# Patient Record
Sex: Male | Born: 1985 | Hispanic: Yes | Marital: Single | Smoking: Current some day smoker
Health system: Southern US, Community
[De-identification: ages and names within clinical notes are randomized; demographics above are authoritative.]

---

## 2018-05-11 ENCOUNTER — Emergency Department (INDEPENDENT_AMBULATORY_CARE_PROVIDER_SITE_OTHER): Payer: Self-pay

## 2018-05-11 ENCOUNTER — Other Ambulatory Visit: Payer: Self-pay

## 2018-05-11 ENCOUNTER — Emergency Department
Admission: EM | Admit: 2018-05-11 | Discharge: 2018-05-11 | Disposition: A | Discharge status: Home / Self Care | Payer: Self-pay | Source: Home / Self Care | Attending: Emergency Medicine | Admitting: Emergency Medicine

## 2018-05-11 DIAGNOSIS — M5431 Sciatica, right side: Secondary | ICD-10-CM

## 2018-05-11 DIAGNOSIS — M545 Low back pain: Secondary | ICD-10-CM

## 2018-05-11 MED ORDER — PREDNISONE 10 MG (21) PO TBPK: ORAL_TABLET | ORAL | 0 refills | Status: DC

## 2018-05-11 MED ORDER — TRAMADOL HCL 50 MG PO TABS: 50.0000 mg | ORAL_TABLET | Freq: Four times a day (QID) | ORAL | 0 refills | Status: DC | PRN

## 2018-05-11 NOTE — Discharge Instructions (Signed)
Take medication as instructed. Follow-up with your family doctor. Do not take ibuprofen or Aleve while you are on prednisone.

## 2018-05-11 NOTE — ED Provider Notes (Signed)
[facility]    CSN: [id number] Arrival date & time: 05/11/18  1042     History   Chief Complaint Chief Complaint  Patient presents with  . Back Pain    HPI Anthony Neal is a 33 y.o. male.   HPI Patient was in his usual state of health until yesterday evening when while shoveling gravel he experienced severe pain in the right lower back.  He has had pain radiating down the back of his right leg to his foot.  He denies any bowel bladder incontinence.  He states he had a similar episode about 7 months ago.  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 History reviewed. No pertinent past medical history.  There are no active problems to display for this patient.   History reviewed. No pertinent surgical history.     Home Medications    Prior to Admission medications   Medication Sig Start Date End Date Taking? Authorizing Provider  predniSONE (STERAPRED UNI-PAK 21 TAB) 10 MG (21) TBPK tablet Take 6 for 1 day, 5 for 1 day, 4 for 1 day, 3 for 1 day, 2 for 1 day, 1 for 1 day 05/11/18   Collene Gobble, MD  traMADol (ULTRAM) 50 MG tablet Take 1 tablet (50 mg total) by mouth every 6 (six) hours as needed. 05/11/18   Collene Gobble, MD    Family History History reviewed. No pertinent family history.  Social History Social History   Tobacco Use  . Smoking status: Not on file  Substance Use Topics  . Alcohol use: Not on file  . Drug use: Not on file     Allergies   Patient has no known allergies.   Review of Systems Review of Systems  Constitutional: Negative.   Respiratory: Negative.   Cardiovascular: Negative.   Genitourinary: Negative.   Musculoskeletal: Positive for back pain. Negative for neck pain.  Neurological: Negative.      Physical Exam Triage Vital Signs ED Triage Vitals [05/11/18 1125]  Enc Vitals Group     BP 137/77     Pulse Rate 74     Resp 18   Temp 97.7 F (36.5 C)     Temp Source Oral     SpO2 100 %     Weight 141 lb (64 kg)     Height 5\' 5"  (1.651 m)     Head Circumference      Peak Flow      Pain Score 9     Pain Loc      Pain Edu?      Excl. in GC?    No data found.  Updated Vital Signs BP 137/77 (BP Location: Right Arm)   Pulse 74   Temp 97.7 F (36.5 C) (Oral)   Resp 18   Ht 5\' 5"  (1.651 m)   Wt 64 kg   SpO2 100%   BMI 23.46 kg/m   Visual Acuity Right Eye Distance:   Left Eye Distance:   Bilateral Distance:    Right Eye Near:   Left Eye Near:    Bilateral Near:     Physical Exam Constitutional:      Appearance: Normal appearance.  Neck:     Musculoskeletal: Normal range of motion and neck supple.  Cardiovascular:     Rate and Rhythm: Normal rate and regular rhythm.  Pulmonary:     Effort: Pulmonary effort  is normal.     Breath sounds: Normal breath sounds.  Abdominal:     General: Abdomen is flat.     Palpations: Abdomen is soft.  Musculoskeletal:     Comments: There is tenderness over L5-S1 on the right.  Straight leg raising positive on the right at 75 degrees seated.  Deep tendon reflexes knees 2+ ankles 2+.  There is no sensory deficit of the lower extremities motor strength 5 out of 5 all muscle groups.  Neurological:     Mental Status: He is alert.      UC Treatments / Results  Labs (all labs ordered are listed, but only abnormal results are displayed) Labs Reviewed - No data to display  EKG None  Radiology Dg Lumbar Spine Complete  Result Date: 05/11/2018 CLINICAL DATA:  Lower back pain after injury last night. EXAM: LUMBAR SPINE - COMPLETE 4+ VIEW COMPARISON:  None. FINDINGS: There is no evidence of lumbar spine fracture. Alignment is normal. Intervertebral disc spaces are maintained. IMPRESSION: Negative. Electronically Signed   By: Lupita Raider, M.D.   On: 05/11/2018 12:05    Procedures Procedures (including critical care time)  Medications Ordered in  UC Medications - No data to display  Initial Impression / Assessment and Plan / UC Course  I have reviewed the triage vital signs and the nursing notes.  Pertinent labs & imaging results that were available during my care of the patient were reviewed by me and considered in my medical decision making (see chart for details). Patient has a LS strain with radiculopathy down the right leg.  X-rays are normal.  Patient will be given medications for pain along with a Dosepak.  PMP aware consulted he has no prescriptions for pain medications in the last 3 years.      Final Clinical Impressions(s) / UC Diagnoses   Final diagnoses:  Sciatica of right side     Discharge Instructions     Take medication as instructed. Follow-up with your family doctor. Do not take ibuprofen or Aleve while you are on prednisone.     ED Prescriptions    Medication Sig Dispense Auth. Provider   traMADol (ULTRAM) 50 MG tablet Take 1 tablet (50 mg total) by mouth every 6 (six) hours as needed. 15 tablet Collene Gobble, MD   predniSONE (STERAPRED UNI-PAK 21 TAB) 10 MG (21) TBPK tablet Take 6 for 1 day, 5 for 1 day, 4 for 1 day, 3 for 1 day, 2 for 1 day, 1 for 1 day 21 tablet Linn Goetze, Maylon Peppers, MD     Controlled Substance Prescriptions [place] Controlled Substance Registry consulted? Yes, I have consulted the [place] Controlled Substances Registry for this patient, and feel the risk/benefit ratio today is favorable for proceeding with this prescription for a controlled substance.   Collene Gobble, MD 05/11/18 1239

## 2018-05-11 NOTE — ED Triage Notes (Signed)
Pt was shoveling gravel last night around 7 or 8 and felt a pull in his back, and is having severe pain the shoots down right buttocks into right leg.

## 2019-04-17 ENCOUNTER — Encounter: Payer: Self-pay | Admitting: *Deleted

## 2019-04-17 ENCOUNTER — Emergency Department
Admission: EM | Admit: 2019-04-17 | Discharge: 2019-04-17 | Disposition: A | Discharge status: Home / Self Care | Payer: Self-pay | Source: Home / Self Care | Attending: Family Medicine | Admitting: Family Medicine

## 2019-04-17 ENCOUNTER — Other Ambulatory Visit: Payer: Self-pay

## 2019-04-17 DIAGNOSIS — R05 Cough: Secondary | ICD-10-CM

## 2019-04-17 DIAGNOSIS — U071 COVID-19: Secondary | ICD-10-CM

## 2019-04-17 DIAGNOSIS — J029 Acute pharyngitis, unspecified: Secondary | ICD-10-CM

## 2019-04-17 LAB — POC SARS CORONAVIRUS 2 AG -  ED: SARS Coronavirus 2 Ag: POSITIVE — AB

## 2019-04-17 NOTE — ED Triage Notes (Signed)
Pt c/po cough, HA, chills and body aches x 1 wk.Denies fever.

## 2019-04-17 NOTE — Discharge Instructions (Addendum)
Take plain guaifenesin (1200mg  extended release tabs such as Mucinex) twice daily, with plenty of water, for cough and congestion.  May add Pseudoephedrine (30mg , one or two every 4 to 6 hours) for sinus congestion.  Get adequate rest.   May take Delsym Cough Suppressant at bedtime for nighttime cough.  Try warm salt water gargles for sore throat.  Stop all antihistamines (Allegra, Nyquil, etc) for now, and other non-prescription cough/cold preparations. May take Ibuprofen 200mg , 4 tabs every 8 hours with food for leg pain, body aches, etc. May take Tylenol 2 or 3 times as needed for pain.   Your COVID19 test is positive.  You are infected with the novel coronavirus and could give the virus to others.  Please continue isolation at home for at least 10 days since the start of your symptoms.  Once you complete your 10 day quarantine, you may return to normal activities as long as you've not had a fever for over 24 hours (without taking fever reducing medicine) and your symptoms are improving. Please continue good preventive care measures, including:  frequent hand-washing, avoid touching your face, cover coughs/sneezes, stay out of crowds and keep a 6 foot distance from others.  Go to the nearest hospital emergency room if fever/cough/breathlessness are severe or illness seems like a threat to life.

## 2019-04-17 NOTE — ED Provider Notes (Signed)
[facility]    CSN: [id number] Arrival date & time: 04/17/19  6767      History   Chief Complaint Chief Complaint  Patient presents with  . Cough    HPI Anthony Neal is a 33 y.o. male.   Patient complains of one week history of typical cold-like symptoms developing over several days, including mild sore throat, sinus congestion, headache, fatigue, and cough.  The fatigue and cough have become worse.  He denies shortness of breath and changes in taste/smell.    The history is provided by the patient.    History reviewed. No pertinent past medical history.  There are no problems to display for this patient.   History reviewed. No pertinent surgical history.     Home Medications    Prior to Admission medications   Not on File    Family History History reviewed. No pertinent family history.  Social History Social History   Tobacco Use  . Smoking status: Current Some Day Smoker  . Smokeless tobacco: Never Used  Substance Use Topics  . Alcohol use: Never  . Drug use: Never     Allergies   Patient has no known allergies.   Review of Systems Review of Systems + sore throat + cough No pleuritic pain, but has tightness in anterior chest No wheezing + nasal congestion + post-nasal drainage No sinus pain/pressure No itchy/red eyes No earache No hemoptysis No SOB No fever, + chills No nausea No vomiting No abdominal pain No diarrhea No urinary symptoms No skin rash + fatigue + myalgias + headache Used OTC meds (Ibuprofen) without relief   Physical Exam Triage Vital Signs ED Triage Vitals  Enc Vitals Group     BP 04/17/19 0912 128/73     Pulse Rate 04/17/19 0912 78     Resp 04/17/19 0912 16     Temp 04/17/19 0912 98 F (36.7 C)     Temp Source 04/17/19 0912 Oral     SpO2 04/17/19 0912 98 %     Weight 04/17/19 0911 140 lb (63.5 kg)     Height 04/17/19 0911 5\' 5"  (1.651 m)     Head Circumference --      Peak Flow --      Pain Score 04/17/19 0913 0     Pain Loc --      Pain Edu? --      Excl. in GC? --    No data found.  Updated Vital Signs BP 128/73 (BP Location: Right Arm)   Pulse 78   Temp 98 F (36.7 C) (Oral)   Resp 16   Ht 5\' 5"  (1.651 m)   Wt 63.5 kg   SpO2 98%   BMI 23.30 kg/m   Visual Acuity Right Eye Distance:   Left Eye Distance:   Bilateral Distance:    Right Eye Near:   Left Eye Near:    Bilateral Near:     Physical Exam Nursing notes and Vital Signs reviewed. Appearance:  Patient appears stated age, and in no acute distress Eyes:  Pupils are equal, round, and reactive to light and accomodation.  Extraocular movement is intact.  Conjunctivae are not inflamed  Ears:  Canals normal.  Tympanic membranes normal.  Nose:  Mildly congested turbinates.  No sinus tenderness.   Pharynx:  Normal Neck:  Supple.  Mildly enlarged lateral nodes are present, tender to palpation on the left.   Lungs:  Clear to auscultation.  Breath sounds are equal.  Moving air well. Heart:  Regular rate and rhythm without murmurs, rubs, or gallops.  Abdomen:  Nontender without masses or hepatosplenomegaly.  Bowel sounds are present.  No CVA or flank tenderness.  Extremities:  No edema.  Skin:  No rash present.   UC Treatments / Results  Labs (all labs ordered are listed, but only abnormal results are displayed) Labs Reviewed  POC SARS CORONAVIRUS 2 AG -  ED - Abnormal; Notable for the following components:      Result Value   SARS Coronavirus 2 Ag Positive (*)    All other components within normal limits    EKG   Radiology No results found.  Procedures Procedures (including critical care time)  Medications Ordered in UC Medications - No data to display  Initial Impression / Assessment and Plan / UC Course  I have reviewed the triage vital signs and the nursing notes.  Pertinent labs & imaging results that were available during my care of the patient were reviewed by me and considered  in my medical decision making (see chart for details).    Benign exam.  Treat symptomatically for now.   Final Clinical Impressions(s) / UC Diagnoses   Final diagnoses:  [id number] virus infection     Discharge Instructions     Take plain guaifenesin (1200mg  extended release tabs such as Mucinex) twice daily, with plenty of water, for cough and congestion.  May add Pseudoephedrine (30mg , one or two every 4 to 6 hours) for sinus congestion.  Get adequate rest.   May take Delsym Cough Suppressant at bedtime for nighttime cough.  Try warm salt water gargles for sore throat.  Stop all antihistamines (Allegra, Nyquil, etc) for now, and other non-prescription cough/cold preparations. May take Ibuprofen 200mg , 4 tabs every 8 hours with food for leg pain, body aches, etc. May take Tylenol 2 or 3 times as needed for pain.   Your COVID19 test is positive.  You are infected with the novel coronavirus and could give the virus to others.  Please continue isolation at home for at least 10 days since the start of your symptoms.  Once you complete your 10 day quarantine, you may return to normal activities as long as you've not had a fever for over 24 hours (without taking fever reducing medicine) and your symptoms are improving. Please continue good preventive care measures, including:  frequent hand-washing, avoid touching your face, cover coughs/sneezes, stay out of crowds and keep a 6 foot distance from others.  Go to the nearest hospital emergency room if fever/cough/breathlessness are severe or illness seems like a threat to life.    ED Prescriptions    None        Kandra Nicolas, MD 04/17/19 1016

## 2020-03-03 IMAGING — DX DG LUMBAR SPINE COMPLETE 4+V
5 series · 5 of 5 positions shown · non-contrast
Comparison: None.

CLINICAL DATA: Lower back pain after injury last night.

EXAM:
LUMBAR SPINE - COMPLETE 4+ VIEW

[l-spine ap]
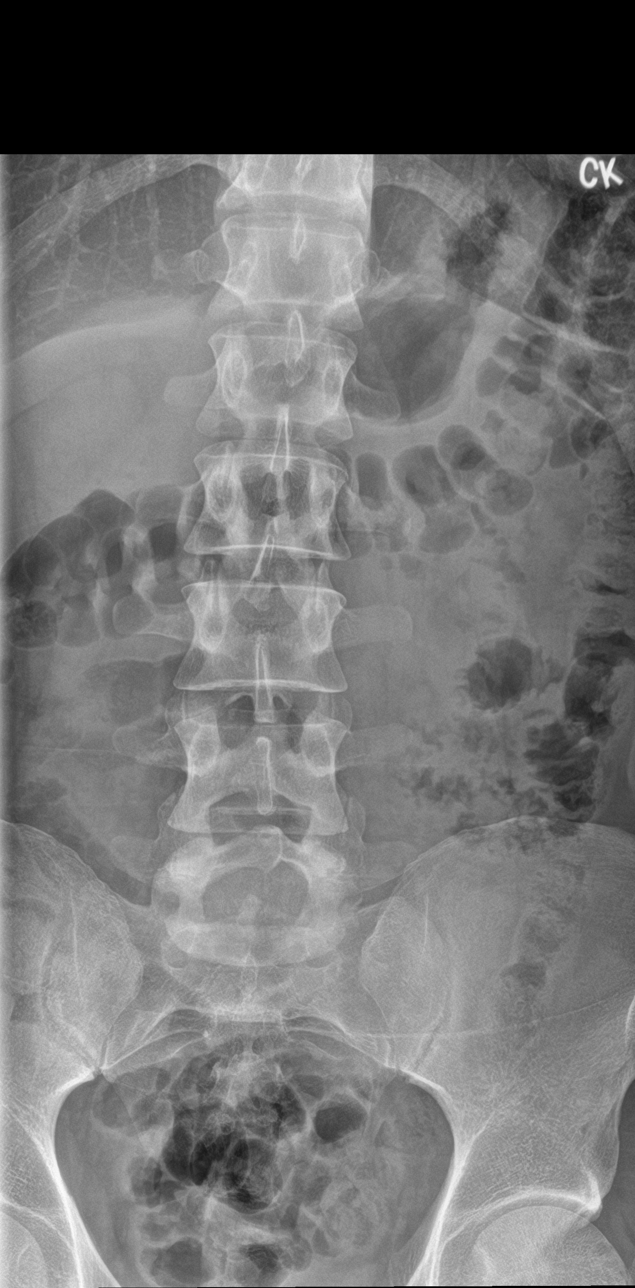

[l-spine obl (1 of 2)]
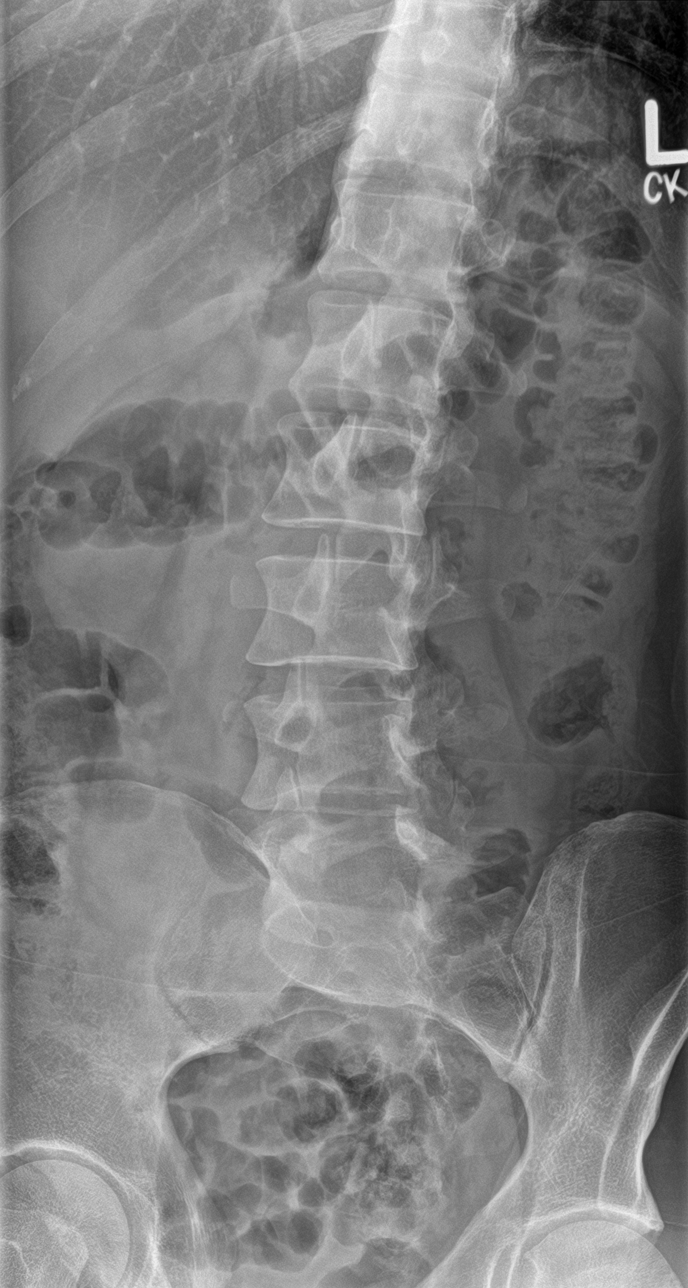

[l-spine obl (2 of 2)]
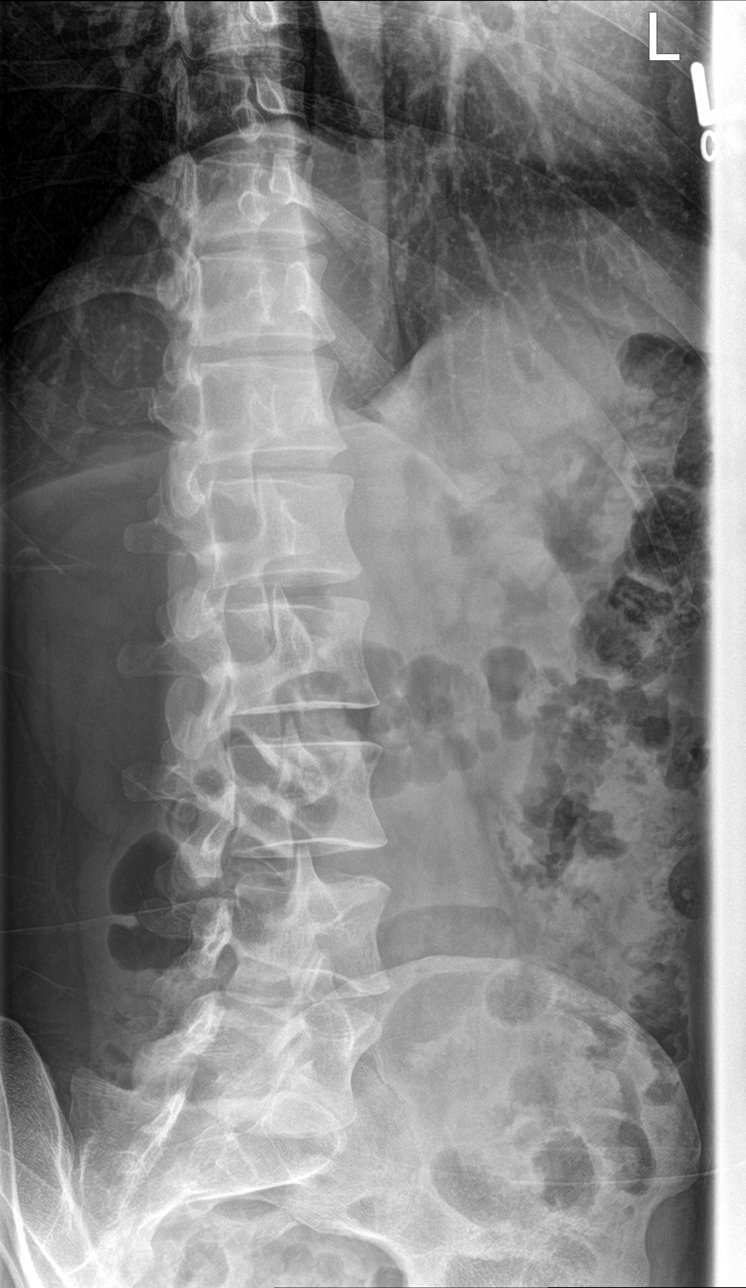

[l-spine lat]
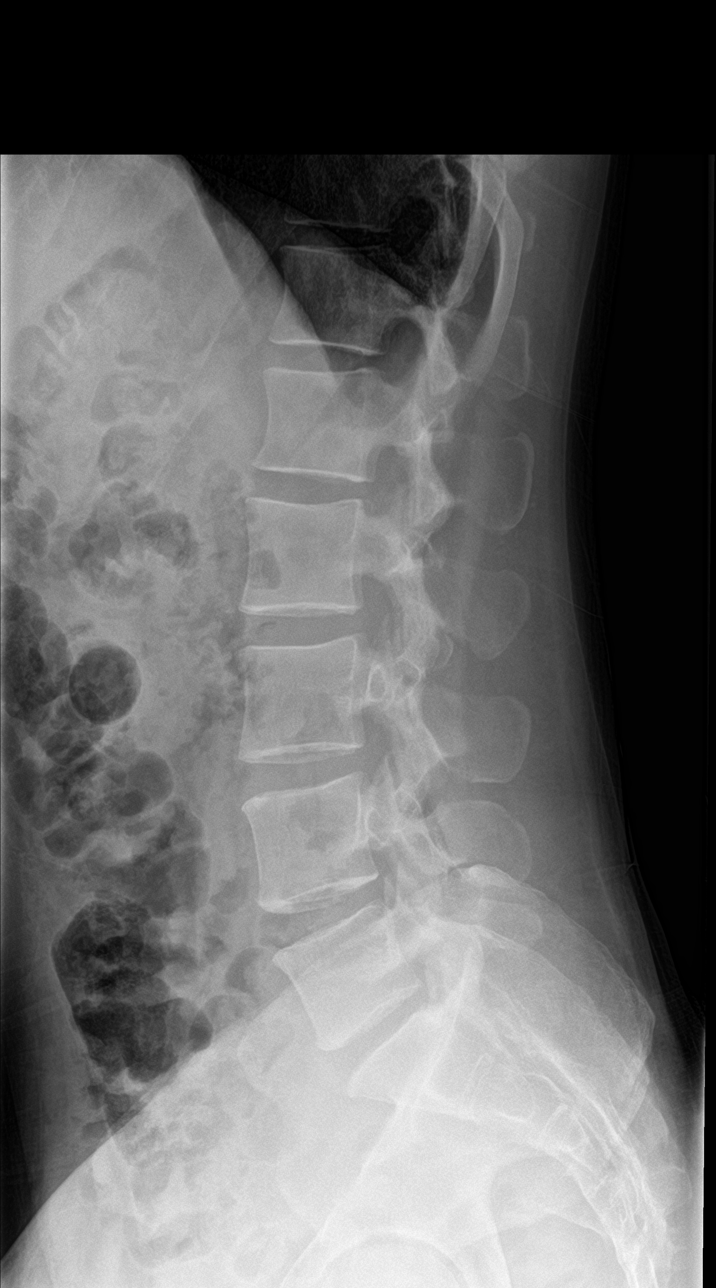

[l-spine spot]
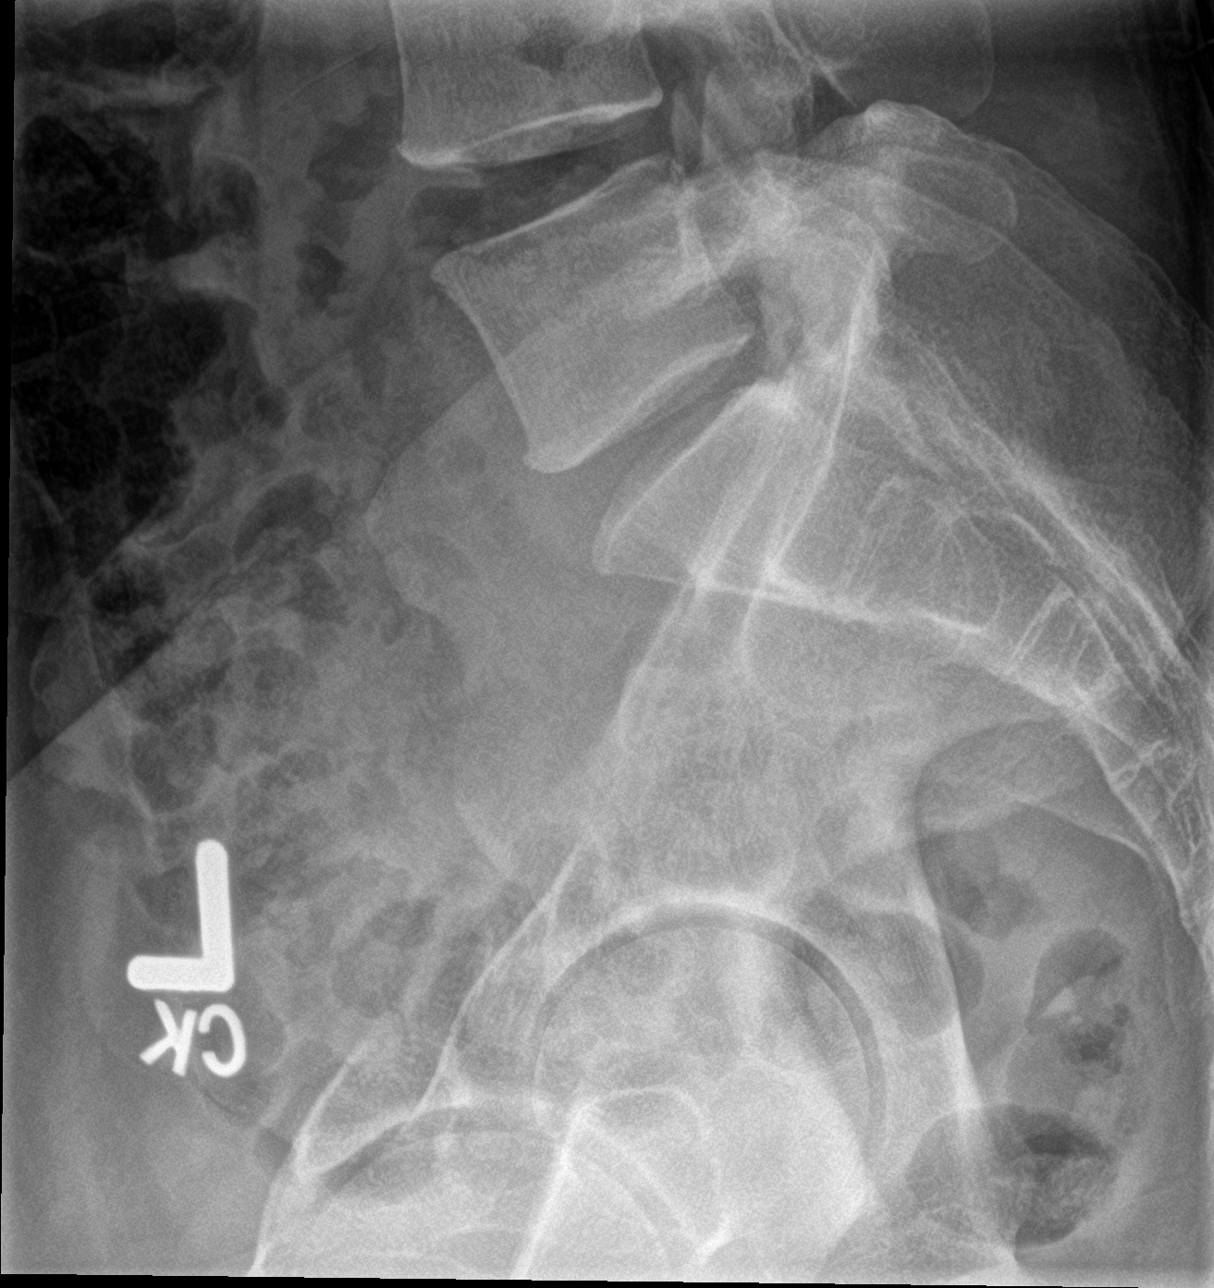

[5 of 5 positions shown; findings below may reference images not displayed]

FINDINGS: There is no evidence of lumbar spine fracture. Alignment is normal.
Intervertebral disc spaces are maintained.
IMPRESSION: Negative.

## 2021-07-26 ENCOUNTER — Other Ambulatory Visit: Payer: Self-pay

## 2021-07-26 ENCOUNTER — Emergency Department (HOSPITAL_BASED_OUTPATIENT_CLINIC_OR_DEPARTMENT_OTHER)
Admission: EM | Admit: 2021-07-26 | Discharge: 2021-07-26 | Disposition: A | Discharge status: Home / Self Care | Payer: BC Managed Care – PPO | Attending: Emergency Medicine | Admitting: Emergency Medicine

## 2021-07-26 ENCOUNTER — Encounter (HOSPITAL_BASED_OUTPATIENT_CLINIC_OR_DEPARTMENT_OTHER): Payer: Self-pay | Admitting: Emergency Medicine

## 2021-07-26 DIAGNOSIS — M5441 Lumbago with sciatica, right side: Secondary | ICD-10-CM | POA: Diagnosis not present

## 2021-07-26 DIAGNOSIS — M545 Low back pain, unspecified: Secondary | ICD-10-CM | POA: Diagnosis present

## 2021-07-26 MED ORDER — METHOCARBAMOL 500 MG PO TABS
500.0000 mg | ORAL_TABLET | Freq: Once | ORAL | Status: AC
  Administered 2021-07-26: 500 mg via ORAL
  Filled 2021-07-26: qty 1

## 2021-07-26 MED ORDER — KETOROLAC TROMETHAMINE 30 MG/ML IJ SOLN
30.0000 mg | Freq: Once | INTRAMUSCULAR | Status: AC
  Administered 2021-07-26: 30 mg via INTRAMUSCULAR
  Filled 2021-07-26: qty 1

## 2021-07-26 MED ORDER — METHOCARBAMOL 500 MG PO TABS: 500.0000 mg | ORAL_TABLET | Freq: Two times a day (BID) | ORAL | 0 refills | Status: AC

## 2021-07-26 MED ORDER — NAPROXEN 500 MG PO TABS: 500.0000 mg | ORAL_TABLET | Freq: Two times a day (BID) | ORAL | 0 refills | Status: AC

## 2021-07-26 NOTE — Discharge Instructions (Signed)
You have been diagnosed with right lower back pain and sciatica after a soccer injury. I have prescribed you both, a muscle relaxer (Robaxin), and an antiinflammatory (Naproxen). Please take Naproxen every 12 hours as prescribed. Please take Robaxin as needed for pain. Do not take Robaxin prior to driving or operating heavy machinery as it can make you sleepy. I also recommend heating pads, stretching as tolerated, walking as tolerated as all of these will help your pain in the long run. Please call the Orthopedic office to schedule an appointment for further management as they may be able to offer you more solutions.  ? ?If you develop any concerning symptoms such as the inability to urinate or have a bowel movement, not able to control your bowel or bladder, problems with erections, or numbness in your upper inner thighs and genital area.  ?

## 2021-07-26 NOTE — ED Provider Notes (Signed)
?[facility] ?Provider Note ? ? ?CSN: [id number] ?Arrival date & time: 07/26/21  0845 ? ?  ? ?History ? ?Chief Complaint  ?Patient presents with  ? Back Pain  ? ? ?Anthony Neal is a 36 y.o. male.  Presents emergency department with complaint of lower back pain.  He said yesterday he was playing soccer when he did a sudden twisting motion of his lower back.  Says he started noticing pain in the right lower side.  He was noticing it after he was playing soccer and it hurt really bad, but he was still able to function normally.  He says he went to bed last night and woke up having intense right lower back pain down the right side of his buttocks.  It is worse with movement.  He denies any saddle anesthesia, numbness, gait abnormalities, bowel or bladder dysfunction.  He has had lower back pain like this in the past with a clear injury or inciting event and had similar symptoms.  In the past he has had medication and has went to a chiropractor to deal with his symptoms.  Denies any urinary symptoms such as diarrhea or dysuria, testicular pain, fevers/chills, or abdominal pain. ? ? ?Back Pain ? ?  ? ?Home Medications ?Prior to Admission medications   ?Medication Sig Start Date End Date Taking? Authorizing Provider  ?methocarbamol (ROBAXIN) 500 MG tablet Take 1 tablet (500 mg total) by mouth 2 (two) times daily for 14 days. 07/26/21 08/09/21 Yes Lashawne Dura, Finis Bud, PA-C  ?naproxen (NAPROSYN) 500 MG tablet Take 1 tablet (500 mg total) by mouth 2 (two) times daily for 7 days. 07/26/21 08/02/21 Yes Stassi Fadely, Finis Bud, PA-C  ?   ? ?Allergies    ?Patient has no known allergies.   ? ?Review of Systems   ?Review of Systems  ?Musculoskeletal:  Positive for back pain.  ?All other systems reviewed and are negative. ? ?Physical Exam ?Updated Vital Signs ?BP 119/80 (BP Location: Right Arm)   Pulse 64   Temp 97.7 ?F (36.5 ?C) (Oral)   Resp 18   Ht 5\' 5"  (1.651 m)   Wt 61.2 kg   SpO2 100%   BMI 22.47 kg/m?   ?Physical Exam ?Vitals and nursing note reviewed.  ?Constitutional:   ?   General: He is not in acute distress. ?   Appearance: Normal appearance. He is well-developed. He is not ill-appearing, toxic-appearing or diaphoretic.  ?HENT:  ?   Head: Normocephalic and atraumatic.  ?   Nose: No nasal deformity.  ?   Mouth/Throat:  ?   Lips: Pink. No lesions.  ?Eyes:  ?   General: Gaze aligned appropriately. No scleral icterus.    ?   Right eye: No discharge.     ?   Left eye: No discharge.  ?   Conjunctiva/sclera: Conjunctivae normal.  ?   Right eye: Right conjunctiva is not injected. No exudate or hemorrhage. ?   Left eye: Left conjunctiva is not injected. No exudate or hemorrhage. ?Cardiovascular:  ?   Pulses: Normal pulses.  ?Pulmonary:  ?   Effort: Pulmonary effort is normal. No respiratory distress.  ?Abdominal:  ?   General: Abdomen is flat. There is no distension.  ?   Palpations: Abdomen is soft.  ?   Tenderness: There is no abdominal tenderness. There is no right CVA tenderness, left CVA tenderness, guarding or rebound.  ?Musculoskeletal:  ?   Comments: No midline tenderness of spine, no stepoff or deformity;  reproducible muscular tenderness in paraspinal muscles ?DP/PT pulses 2+ and equal bilaterally ?No leg edema ?Sensation grossly intact on anterior thighs, dorsum of foot and lateral foot ?Strength of knee flexion and extension is 5/5 ?Plantar and dorsiflexion of ankle 5/5 ?Gait with difficulty due to pain ?+ straight leg raise on Right, absent on left ?  ?Skin: ?   General: Skin is warm and dry.  ?Neurological:  ?   Mental Status: He is alert and oriented to person, place, and time.  ?Psychiatric:     ?   Mood and Affect: Mood normal.     ?   Speech: Speech normal.     ?   Behavior: Behavior normal. Behavior is cooperative.  ? ? ?ED Results / Procedures / Treatments   ?Labs ?(all labs ordered are listed, but only abnormal results are displayed) ?Labs Reviewed - No data to display ? ?EKG ?None ? ?Radiology ?No  results found. ? ?Procedures ?Procedures  ? ?Medications Ordered in ED ?Medications  ?methocarbamol (ROBAXIN) tablet 500 mg (500 mg Oral Given 07/26/21 0927)  ?ketorolac (TORADOL) 30 MG/ML injection 30 mg (30 mg Intramuscular Given 07/26/21 0928)  ? ? ?ED Course/ Medical Decision Making/ A&P ?  ?                        ?Medical Decision Making ?Risk ?Prescription drug management. ? ? ? ?MDM  ?This is a 36 y.o. male who presents to the ED with right lower back pain ?The differential of this patient includes but is not limited to AAA rupture, Cauda Equina, Compression Fracture, Epidural Abscess, herniated Disc, Kidney Stone, Nonspecific, Pyelonephritis, Vertebral Fracture. ? ?My Impression, Plan, and ED Course: Patient presents with lower back pain after known traumatic event. He has no red flag symptoms. I doubt that this is cauda equina. No urinary symptoms to suggest UTI/Pyelo. Msk injury more likely with inciting injury than kidney stone. Doubt compression fracture given no midline tenderness. No infectious symptoms or risk factors to suggest epidural abscess or osteo. This could be a herniated disc, but imaging is not indicated at this time.  ? ?I will treat his symptoms with Robaxin and Toradol here. Plan to send to Ortho for further management. Return precautions provided if red flag symptoms occur.  ? ?Charting Requirements ?Additional history is obtained from:  Independent historian ?External Records from outside source obtained and reviewed including: Previous UC note regarding previous similar symptoms ?Social Determinants of Health:  none ?Pertinant PMH that complicates patient's illness: n/a ? ?Patient Care ?Problems that were addressed during this visit: ?- Lower back pain with sciatica: Acute illness with complication ?Disposition: ortho f/u ? ?This is was an independent visit. My attending physician was immediately available if needed. ? ?Portions of this note were generated with Scientist, clinical (histocompatibility and immunogenetics).  Dictation errors may occur despite best attempts at proofreading. ? Final Clinical Impression(s) / ED Diagnoses ?Final diagnoses:  ?Acute right-sided low back pain with right-sided sciatica  ? ? ?Rx / DC Orders ?ED Discharge Orders   ? ?      Ordered  ?  methocarbamol (ROBAXIN) 500 MG tablet  2 times daily       ? 07/26/21 0936  ?  naproxen (NAPROSYN) 500 MG tablet  2 times daily       ? 07/26/21 0936  ? ?  ?  ? ?  ? ? ?  ?Claudie Leach, PA-C ?07/26/21 1057 ? ?  ?Lorre Nick, MD ?  07/30/21 0819 ? ?

## 2021-07-26 NOTE — ED Triage Notes (Signed)
Pt arrives to ED with c/o lower back pain. This started yesterday when patient was playing soccer. He reports the pain started after a twisting motion. The pain is described as a sharp. The pain does radiate down the right leg. Pt denies urinary/bowel incontinence.  ?

## 2022-04-06 ENCOUNTER — Ambulatory Visit
Admission: EM | Admit: 2022-04-06 | Discharge: 2022-04-06 | Disposition: A | Discharge status: Home / Self Care | Payer: BC Managed Care – PPO | Attending: Physician Assistant | Admitting: Physician Assistant

## 2022-04-06 DIAGNOSIS — J069 Acute upper respiratory infection, unspecified: Secondary | ICD-10-CM | POA: Insufficient documentation

## 2022-04-06 DIAGNOSIS — Z1152 Encounter for screening for COVID-19: Secondary | ICD-10-CM | POA: Diagnosis not present

## 2022-04-06 LAB — POCT INFLUENZA A/B
Influenza A, POC: NEGATIVE
Influenza B, POC: NEGATIVE

## 2022-04-06 NOTE — ED Triage Notes (Signed)
Pt presents with sinus headache, congestion, chills, generalized body aches, nasal drainage, and fatigue since waking up this morning.

## 2022-04-07 ENCOUNTER — Encounter: Payer: Self-pay | Admitting: Physician Assistant

## 2022-04-07 LAB — SARS CORONAVIRUS 2 (TAT 6-24 HRS): SARS Coronavirus 2: NEGATIVE

## 2022-04-07 NOTE — ED Provider Notes (Signed)
[facility]    CSN: [id number] Arrival date & time: 04/06/22  1102      History   Chief Complaint Chief Complaint  Patient presents with   Headache   Generalized Body Aches   Chills   URI   Fatigue    HPI Muhsin Doris is a 36 y.o. male.   Patient here today for evaluation of sinus congestion and headache, chills, body aches, nasal drainage and fatigue that started when he woke this morning.  He notes he has taken over-the-counter medication without resolution.  He has not had any vomiting or diarrhea.  The history is provided by the patient.  Headache Associated symptoms: congestion, cough, fever, sore throat and URI   Associated symptoms: no abdominal pain, no diarrhea, no ear pain, no nausea and no vomiting   URI Presenting symptoms: congestion, cough, fever and sore throat   Presenting symptoms: no ear pain   Associated symptoms: headaches     History reviewed. No pertinent past medical history.  There are no problems to display for this patient.   History reviewed. No pertinent surgical history.     Home Medications    Prior to Admission medications   Not on File    Family History History reviewed. No pertinent family history.  Social History Social History   Tobacco Use   Smoking status: Some Days   Smokeless tobacco: Never  Vaping Use   Vaping Use: Never used  Substance Use Topics   Alcohol use: Never   Drug use: Never     Allergies   Patient has no known allergies.   Review of Systems Review of Systems  Constitutional:  Positive for chills and fever.  HENT:  Positive for congestion and sore throat. Negative for ear pain.   Eyes:  Negative for discharge and redness.  Respiratory:  Positive for cough. Negative for shortness of breath.   Gastrointestinal:  Negative for abdominal pain, diarrhea, nausea and vomiting.  Neurological:  Positive for headaches.     Physical Exam Triage Vital Signs ED Triage Vitals  Enc  Vitals Group     BP 04/06/22 1444 106/72     Pulse Rate 04/06/22 1444 89     Resp 04/06/22 1444 18     Temp 04/06/22 1444 99.9 F (37.7 C)     Temp Source 04/06/22 1444 Oral     SpO2 04/06/22 1444 99 %     Weight --      Height --      Head Circumference --      Peak Flow --      Pain Score 04/06/22 1445 6     Pain Loc --      Pain Edu? --      Excl. in GC? --    No data found.  Updated Vital Signs BP 106/72 (BP Location: Left Arm)   Pulse 89   Temp 99.9 F (37.7 C) (Oral)   Resp 18   SpO2 99%   Physical Exam Vitals and nursing note reviewed.  Constitutional:      General: He is not in acute distress.    Appearance: Normal appearance. He is not ill-appearing.  HENT:     Head: Normocephalic and atraumatic.     Nose: Congestion present.     Mouth/Throat:     Mouth: Mucous membranes are moist.     Pharynx: Oropharynx is clear. No oropharyngeal exudate or posterior oropharyngeal erythema.  Eyes:     Conjunctiva/sclera: Conjunctivae  normal.  Cardiovascular:     Rate and Rhythm: Normal rate and regular rhythm.     Heart sounds: Normal heart sounds. No murmur heard. Pulmonary:     Effort: Pulmonary effort is normal. No respiratory distress.     Breath sounds: Normal breath sounds. No wheezing, rhonchi or rales.  Skin:    General: Skin is warm and dry.  Neurological:     Mental Status: He is alert.  Psychiatric:        Mood and Affect: Mood normal.        Thought Content: Thought content normal.      UC Treatments / Results  Labs (all labs ordered are listed, but only abnormal results are displayed) Labs Reviewed  SARS CORONAVIRUS 2 (TAT 6-24 HRS)  POCT INFLUENZA A/B    EKG   Radiology No results found.  Procedures Procedures (including critical care time)  Medications Ordered in UC Medications - No data to display  Initial Impression / Assessment and Plan / UC Course  I have reviewed the triage vital signs and the nursing notes.  Pertinent labs  & imaging results that were available during my care of the patient were reviewed by me and considered in my medical decision making (see chart for details).    Suspect viral etiology of symptoms.  Will screen for COVID and flu.  Recommend symptomatic treatment, increase fluids and rest while awaiting results.  Encouraged follow-up with any further concerns or worsening symptoms.  Final Clinical Impressions(s) / UC Diagnoses   Final diagnoses:  Encounter for screening for COVID-19  Acute upper respiratory infection   Discharge Instructions   None    ED Prescriptions   None    PDMP not reviewed this encounter.   Tomi Bamberger, PA-C 04/07/22 1320

## 2023-06-20 ENCOUNTER — Ambulatory Visit
Admission: EM | Admit: 2023-06-20 | Discharge: 2023-06-20 | Disposition: A | Discharge status: Home / Self Care | Payer: No Typology Code available for payment source | Attending: Emergency Medicine | Admitting: Emergency Medicine

## 2023-06-20 ENCOUNTER — Encounter: Payer: Self-pay | Admitting: Emergency Medicine

## 2023-06-20 DIAGNOSIS — Z87891 Personal history of nicotine dependence: Secondary | ICD-10-CM | POA: Diagnosis not present

## 2023-06-20 DIAGNOSIS — R300 Dysuria: Secondary | ICD-10-CM | POA: Insufficient documentation

## 2023-06-20 DIAGNOSIS — Z113 Encounter for screening for infections with a predominantly sexual mode of transmission: Secondary | ICD-10-CM | POA: Diagnosis not present

## 2023-06-20 LAB — POCT URINALYSIS DIP (MANUAL ENTRY)
Bilirubin, UA: NEGATIVE
Blood, UA: NEGATIVE
Glucose, UA: NEGATIVE mg/dL
Ketones, POC UA: NEGATIVE mg/dL
Leukocytes, UA: NEGATIVE
Nitrite, UA: NEGATIVE
Protein Ur, POC: NEGATIVE mg/dL
Spec Grav, UA: 1.02 (ref 1.010–1.025)
Urobilinogen, UA: 0.2 U/dL
pH, UA: 7 (ref 5.0–8.0)

## 2023-06-20 NOTE — ED Provider Notes (Signed)
 [facility]    CSN: [id number] Arrival date & time: 06/20/23  7829      History   Chief Complaint Chief Complaint  Patient presents with   Dysuria    HPI Anthony Neal is a 38 y.o. male.   Patient presents to clinic with concern of dysuria for the past 3 days.  Noticed that the first time when he urinated it was very painful, has become a little less painful over the past 3 days but it is still causing discomfort.  At the end of the first time he urinated with discomfort he noticed that the end of his urination felt sticky.  He does drink coffee and uses preworkout after work when he goes to [employer]  He is married, has had other sexual partners outside of his marriage.  Reports he works in [employer].   Has not had any hematuria.  No flank pain.  Without nausea, vomiting, scrotal pain, sores or lesions.  The history is provided by the patient and medical records.  Dysuria   History reviewed. No pertinent past medical history.  There are no active problems to display for this patient.   History reviewed. No pertinent surgical history.     Home Medications    Prior to Admission medications   Not on File    Family History Family History  Problem Relation Age of Onset   Healthy Mother    Healthy Father     Social History Social History   Tobacco Use   Smoking status: Former    Types: Cigarettes   Smokeless tobacco: Never  Vaping Use   Vaping status: Never Used  Substance Use Topics   Alcohol use: Never   Drug use: Never     Allergies   Patient has no known allergies.   Review of Systems Review of Systems  Per HPI   Physical Exam Triage Vital Signs ED Triage Vitals  Encounter Vitals Group     BP 06/20/23 0830 123/77     Systolic BP Percentile --      Diastolic BP Percentile --      Pulse Rate 06/20/23 0830 68     Resp 06/20/23 0830 16     Temp 06/20/23 0830 98.1 F (36.7 C)     Temp Source 06/20/23 0830 Oral     SpO2  06/20/23 0830 99 %     Weight 06/20/23 0831 140 lb (63.5 kg)     Height 06/20/23 0831 5\' 5"  (1.651 m)     Head Circumference --      Peak Flow --      Pain Score 06/20/23 0831 4     Pain Loc --      Pain Education --      Exclude from Growth Chart --    No data found.  Updated Vital Signs BP 123/77 (BP Location: Right Arm)   Pulse 68   Temp 98.1 F (36.7 C) (Oral)   Resp 16   Ht 5\' 5"  (1.651 m)   Wt 140 lb (63.5 kg)   SpO2 99%   BMI 23.30 kg/m   Visual Acuity Right Eye Distance:   Left Eye Distance:   Bilateral Distance:    Right Eye Near:   Left Eye Near:    Bilateral Near:     Physical Exam Vitals and nursing note reviewed.  Constitutional:      Appearance: Normal appearance.  HENT:     Head: Normocephalic and atraumatic.  Right Ear: External ear normal.     Left Ear: External ear normal.     Nose: Nose normal.  Eyes:     Conjunctiva/sclera: Conjunctivae normal.  Cardiovascular:     Rate and Rhythm: Normal rate.  Pulmonary:     Effort: Pulmonary effort is normal. No respiratory distress.  Abdominal:     Tenderness: There is no right CVA tenderness or left CVA tenderness.  Musculoskeletal:        General: Normal range of motion.  Neurological:     General: No focal deficit present.     Mental Status: He is alert.  Psychiatric:        Mood and Affect: Mood normal.      UC Treatments / Results  Labs (all labs ordered are listed, but only abnormal results are displayed) Labs Reviewed  POCT URINALYSIS DIP (MANUAL ENTRY)  CYTOLOGY, (ORAL, ANAL, URETHRAL) ANCILLARY ONLY    EKG   Radiology No results found.  Procedures Procedures (including critical care time)  Medications Ordered in UC Medications - No data to display  Initial Impression / Assessment and Plan / UC Course  I have reviewed the triage vital signs and the nursing notes.  Pertinent labs & imaging results that were available during my care of the patient were reviewed by me  and considered in my medical decision making (see chart for details).  Vitals and triage reviewed, patient is hemodynamically stable.  Negative for CVA tenderness.  Urinalysis unremarkable.  Unlikely UTI.  Cytology swab obtained due to dysuria and is sexually active male with multiple partners.  Staff will contact if treatment is needed based on results.  Advised avoidance of urinary irritants and increasing water intake.  Plan of care, follow-up care return precautions given, no questions at this time.     Final Clinical Impressions(s) / UC Diagnoses   Final diagnoses:  Dysuria  Screening examination for sexually transmitted disease     Discharge Instructions      Your urine did not show any signs of infection.  We have screened you for some sexually transmitted infections and we will contact you if results are abnormal.  Ensure you are drinking at least 64 ounces of water and avoiding urinary irritants such as soda, juice and caffeine.  Return to clinic or follow-up with a primary care provider for any continued or ongoing symptoms.    ED Prescriptions   None    PDMP not reviewed this encounter.   Rinaldo Ratel [place], [place] 06/20/23 [phone]

## 2023-06-20 NOTE — ED Triage Notes (Signed)
 Patient c/o dysuria x 3 days, denies any hematuria, urgency or frequency.  Describes the feeling of itching after he urinates.  No concern for STI.

## 2023-06-20 NOTE — Discharge Instructions (Addendum)
 Your urine did not show any signs of infection.  We have screened you for some sexually transmitted infections and we will contact you if results are abnormal.  Ensure you are drinking at least 64 ounces of water and avoiding urinary irritants such as soda, juice and caffeine.  Return to clinic or follow-up with a primary care provider for any continued or ongoing symptoms.

## 2023-06-21 LAB — CYTOLOGY, (ORAL, ANAL, URETHRAL) ANCILLARY ONLY
Chlamydia: NEGATIVE
Comment: NEGATIVE
Comment: NEGATIVE
Comment: NORMAL
Neisseria Gonorrhea: NEGATIVE
Trichomonas: NEGATIVE
